# Patient Record
Sex: Female | Born: 1950 | Race: White | Hispanic: No | Marital: Married | Smoking: Never smoker
Health system: Southern US, Community
[De-identification: ages and names within clinical notes are randomized; demographics above are authoritative.]

## PROBLEM LIST (undated history)

## (undated) DIAGNOSIS — R7302 Impaired glucose tolerance (oral): Secondary | ICD-10-CM

## (undated) DIAGNOSIS — M47812 Spondylosis without myelopathy or radiculopathy, cervical region: Secondary | ICD-10-CM

## (undated) DIAGNOSIS — K5903 Drug induced constipation: Secondary | ICD-10-CM

## (undated) DIAGNOSIS — T402X5A Adverse effect of other opioids, initial encounter: Secondary | ICD-10-CM

## (undated) DIAGNOSIS — R011 Cardiac murmur, unspecified: Secondary | ICD-10-CM

## (undated) DIAGNOSIS — M1710 Unilateral primary osteoarthritis, unspecified knee: Secondary | ICD-10-CM

## (undated) DIAGNOSIS — G8929 Other chronic pain: Secondary | ICD-10-CM

## (undated) HISTORY — PX: ACROMIONECTOMY: SHX1124

## (undated) HISTORY — PX: JOINT REPLACEMENT: SHX530

## (undated) HISTORY — PX: HEMORRHOID SURGERY: SHX153

## (undated) HISTORY — PX: SKIN BIOPSY: SHX1

## (undated) HISTORY — PX: AUGMENTATION MAMMAPLASTY: SUR837

## (undated) HISTORY — PX: REPAIR EXTENSOR TENDON: SHX5382

## (undated) HISTORY — PX: TONSILLECTOMY: SUR1361

## (undated) HISTORY — PX: ANTERIOR FUSION CERVICAL SPINE: SUR626

## (undated) HISTORY — PX: RHIZOTOMY: SHX2355

---

## 1983-07-06 HISTORY — PX: BREAST BIOPSY: SHX20

## 2008-11-06 ENCOUNTER — Ambulatory Visit: Payer: Self-pay | Admitting: Internal Medicine

## 2014-10-18 ENCOUNTER — Other Ambulatory Visit: Payer: Self-pay | Admitting: Nurse Practitioner

## 2014-10-18 DIAGNOSIS — Z1231 Encounter for screening mammogram for malignant neoplasm of breast: Secondary | ICD-10-CM

## 2014-11-05 ENCOUNTER — Ambulatory Visit: Payer: Self-pay

## 2014-11-26 ENCOUNTER — Ambulatory Visit (HOSPITAL_BASED_OUTPATIENT_CLINIC_OR_DEPARTMENT_OTHER): Admission: RE | Admit: 2014-11-26 | Payer: Self-pay | Source: Ambulatory Visit

## 2014-12-11 ENCOUNTER — Ambulatory Visit: Payer: Self-pay

## 2014-12-19 ENCOUNTER — Ambulatory Visit
Admission: RE | Admit: 2014-12-19 | Discharge: 2014-12-19 | Disposition: A | Discharge status: Home / Self Care | Payer: Medicare Other | Source: Ambulatory Visit | Attending: Nurse Practitioner | Admitting: Nurse Practitioner

## 2014-12-19 DIAGNOSIS — Z1231 Encounter for screening mammogram for malignant neoplasm of breast: Secondary | ICD-10-CM

## 2015-05-23 ENCOUNTER — Encounter: Admission: RE | Payer: Self-pay | Source: Ambulatory Visit

## 2015-05-23 ENCOUNTER — Ambulatory Visit: Admission: RE | Admit: 2015-05-23 | Payer: Medicare Other | Source: Ambulatory Visit | Admitting: Gastroenterology

## 2015-05-23 SURGERY — COLONOSCOPY WITH PROPOFOL
Anesthesia: General

## 2016-04-04 ENCOUNTER — Ambulatory Visit (INDEPENDENT_AMBULATORY_CARE_PROVIDER_SITE_OTHER)
Admission: EM | Admit: 2016-04-04 | Discharge: 2016-04-04 | Disposition: A | Discharge status: Home / Self Care | Payer: Medicare Other | Source: Home / Self Care | Attending: Family Medicine | Admitting: Family Medicine

## 2016-04-04 ENCOUNTER — Encounter: Payer: Self-pay | Admitting: Emergency Medicine

## 2016-04-04 ENCOUNTER — Emergency Department
Admission: EM | Admit: 2016-04-04 | Discharge: 2016-04-04 | Disposition: A | Discharge status: Home / Self Care | Payer: Medicare Other | Attending: Emergency Medicine | Admitting: Emergency Medicine

## 2016-04-04 ENCOUNTER — Encounter: Payer: Self-pay | Admitting: Gynecology

## 2016-04-04 ENCOUNTER — Emergency Department: Payer: Medicare Other

## 2016-04-04 DIAGNOSIS — Z7982 Long term (current) use of aspirin: Secondary | ICD-10-CM | POA: Diagnosis not present

## 2016-04-04 DIAGNOSIS — W01198A Fall on same level from slipping, tripping and stumbling with subsequent striking against other object, initial encounter: Secondary | ICD-10-CM | POA: Insufficient documentation

## 2016-04-04 DIAGNOSIS — S0083XA Contusion of other part of head, initial encounter: Secondary | ICD-10-CM | POA: Insufficient documentation

## 2016-04-04 DIAGNOSIS — Y939 Activity, unspecified: Secondary | ICD-10-CM | POA: Diagnosis not present

## 2016-04-04 DIAGNOSIS — Y92007 Garden or yard of unspecified non-institutional (private) residence as the place of occurrence of the external cause: Secondary | ICD-10-CM | POA: Diagnosis not present

## 2016-04-04 DIAGNOSIS — T148XXA Other injury of unspecified body region, initial encounter: Secondary | ICD-10-CM | POA: Diagnosis not present

## 2016-04-04 DIAGNOSIS — S0990XA Unspecified injury of head, initial encounter: Secondary | ICD-10-CM

## 2016-04-04 DIAGNOSIS — Y999 Unspecified external cause status: Secondary | ICD-10-CM | POA: Diagnosis not present

## 2016-04-04 DIAGNOSIS — Z791 Long term (current) use of non-steroidal anti-inflammatories (NSAID): Secondary | ICD-10-CM | POA: Insufficient documentation

## 2016-04-04 HISTORY — DX: Unilateral primary osteoarthritis, unspecified knee: M17.10

## 2016-04-04 HISTORY — DX: Spondylosis without myelopathy or radiculopathy, cervical region: M47.812

## 2016-04-04 HISTORY — DX: Other chronic pain: G89.29

## 2016-04-04 HISTORY — DX: Cardiac murmur, unspecified: R01.1

## 2016-04-04 HISTORY — DX: Impaired glucose tolerance (oral): R73.02

## 2016-04-04 HISTORY — DX: Adverse effect of other opioids, initial encounter: T40.2X5A

## 2016-04-04 HISTORY — DX: Adverse effect of other opioids, initial encounter: K59.03

## 2016-04-04 NOTE — ED Provider Notes (Signed)
[facility] Emergency Department Provider Note   ____________________________________________    I have reviewed the triage vital signs and the nursing notes.   HISTORY  Chief Complaint Fall     HPI Jessica Valentine is a 65 y.o. female who presents with a head injury after fall. Patient reports she lost her balance this morning and fell forward and hit her right forehead. She is not on blood thinners. She denies LOC. She denies neuro deficits. She went to  urgent care and was referred to the ED. She denies dizziness or nausea. Overall she feels well. She denies neck pain. No extremity injuries. No abdominal pain or chest pain.   Past Medical History:  Diagnosis Date  . Cardiac murmur   . Cervical spondylosis   . Chronic pain   . Constipation due to opioid therapy   . IGT (impaired glucose tolerance)   . Osteoarthritis of knee, unilateral     There are no active problems to display for this patient.   Past Surgical History:  Procedure Laterality Date  . ACROMIONECTOMY    . ANTERIOR FUSION CERVICAL SPINE    . AUGMENTATION MAMMAPLASTY     1981  . BREAST BIOPSY Left 1985   neg  . HEMORRHOID SURGERY    . JOINT REPLACEMENT    . REPAIR EXTENSOR TENDON    . RHIZOTOMY    . SKIN BIOPSY     right arm  . TONSILLECTOMY      Prior to Admission medications   Medication Sig Start Date End Date Taking? Authorizing Provider  aspirin EC 81 MG tablet Take 81 mg by mouth daily.    Historical Provider, MD  Biotin 1610910000 MCG TABS Take by mouth.    Historical Provider, MD  Chromium Picolinate (CHROMIUM PICOLATE) 1000 MCG TABS Take by mouth.    Historical Provider, MD  cyclobenzaprine (FLEXERIL) 10 MG tablet Take 10 mg by mouth 3 (three) times daily as needed for muscle spasms.    Historical Provider, MD  HYDROcodone-acetaminophen (NORCO) 10-325 MG tablet Take 1 tablet by mouth every 6 (six) hours as needed.    Historical Provider, MD  meloxicam (MOBIC)  15 MG tablet Take 15 mg by mouth daily.    Historical Provider, MD  Methylnaltrexone Bromide (RELISTOR PO) Take by mouth.    Historical Provider, MD  omeprazole (PRILOSEC) 40 MG capsule Take 40 mg by mouth daily.    Historical Provider, MD  pyridoxine (B-6) 100 MG tablet Take 100 mg by mouth daily.    Historical Provider, MD     Allergies Clavulanic acid  History reviewed. No pertinent family history.  Social History Social History  Substance Use Topics  . Smoking status: Never Smoker  . Smokeless tobacco: Never Used  . Alcohol use No    Review of Systems  Constitutional: No Dizziness Eyes: No visual changes.  ENT: No neck pain Cardiovascular: Denies chest pain. Respiratory: Denies shortness of breath. Gastrointestinal: No abdominal pain.  No nausea, no vomiting.    Musculoskeletal: Negative for extremity injury Skin: Oozing to the right forehead Neurological: Negative for focal weakness  10-point ROS otherwise negative.  ____________________________________________   PHYSICAL EXAM:  VITAL SIGNS: ED Triage Vitals  Enc Vitals Group     BP 04/04/16 1409 (!) 180/86     Pulse Rate 04/04/16 1409 93     Resp 04/04/16 1409 16     Temp 04/04/16 1409 98.3 F (36.8 C)     Temp Source 04/04/16  1409 Oral     SpO2 04/04/16 1409 100 %     Weight 04/04/16 1407 152 lb (68.9 kg)     Height 04/04/16 1407 5\' 10"  (1.778 m)     Head Circumference --      Peak Flow --      Pain Score 04/04/16 1408 3     Pain Loc --      Pain Edu? --      Excl. in GC? --     Constitutional: Alert and oriented. No acute distress. Pleasant and interactive Eyes: Conjunctivae are normal.  Head:Contusion right forehead with ecchymosis and swelling, no palpable bony injury Nose: No swelling or injury Mouth/Throat: Mucous membranes are moist.   Neck:  Painless ROM, no vertebral tenderness  Cardiovascular: Normal rate, regular rhythm.  Good peripheral circulation. Respiratory: Normal respiratory  effort.  No retractions. Gastrointestinal: Soft and nontender. No distention. Genitourinary: deferred Musculoskeletal: No extremity injuries.  Warm and well perfused Neurologic:  Normal speech and language. No gross focal neurologic deficits are appreciated.  Skin:  Skin is warm, dry and intact. No rash noted. Psychiatric: Mood and affect are normal. Speech and behavior are normal.  ____________________________________________   LABS (all labs ordered are listed, but only abnormal results are displayed)  Labs Reviewed - No data to display ____________________________________________  EKG  None ____________________________________________  RADIOLOGY  CT head shows no acute injuries ____________________________________________   PROCEDURES  Procedure(s) performed: No    Critical Care performed: No ____________________________________________   INITIAL IMPRESSION / ASSESSMENT AND PLAN / ED COURSE  Pertinent labs & imaging results that were available during my care of the patient were reviewed by me and considered in my medical decision making (see chart for details).  Patient with right forehead injury. CT head pending, no current concussive symptoms.  Clinical Course  ----------------------------------------- 2:58 PM on 04/04/2016 -----------------------------------------  CT head unremarkable, patient stable for discharge. Return precautions discussed. ____________________________________________   FINAL CLINICAL IMPRESSION(S) / ED DIAGNOSES  Final diagnoses:  Closed head injury, initial encounter      NEW MEDICATIONS STARTED DURING THIS VISIT:  New Prescriptions   No medications on file     Note:  This document was prepared using Dragon voice recognition software and may include unintentional dictation errors.    Jene Every, MD 04/04/16 [phone]

## 2016-04-04 NOTE — ED Provider Notes (Signed)
CSN: [id number]     Arrival date & time 04/04/16  1211 History   None    Chief Complaint  Patient presents with  . Head Injury   (Consider location/radiation/quality/duration/timing/severity/associated sxs/prior Treatment) HPI  Past Medical History:  Diagnosis Date  . Cardiac murmur   . Cervical spondylosis   . Chronic pain   . Constipation due to opioid therapy   . IGT (impaired glucose tolerance)   . Osteoarthritis of knee, unilateral    Past Surgical History:  Procedure Laterality Date  . ACROMIONECTOMY    . ANTERIOR FUSION CERVICAL SPINE    . AUGMENTATION MAMMAPLASTY     1981  . BREAST BIOPSY Left 1985   neg  . HEMORRHOID SURGERY    . JOINT REPLACEMENT    . REPAIR EXTENSOR TENDON    . RHIZOTOMY    . SKIN BIOPSY     right arm  . TONSILLECTOMY     No family history on file. Social History  Substance Use Topics  . Smoking status: Never Smoker  . Smokeless tobacco: Never Used  . Alcohol use No   OB History    No data available     Review of Systems  Allergies  Clavulanic acid  Home Medications   Prior to Admission medications   Medication Sig Start Date End Date Taking? Authorizing Provider  aspirin EC 81 MG tablet Take 81 mg by mouth daily.   Yes Historical Provider, MD  Biotin 21308 MCG TABS Take by mouth.   Yes Historical Provider, MD  Chromium Picolinate (CHROMIUM PICOLATE) 1000 MCG TABS Take by mouth.   Yes Historical Provider, MD  cyclobenzaprine (FLEXERIL) 10 MG tablet Take 10 mg by mouth 3 (three) times daily as needed for muscle spasms.   Yes Historical Provider, MD  HYDROcodone-acetaminophen (NORCO) 10-325 MG tablet Take 1 tablet by mouth every 6 (six) hours as needed.   Yes Historical Provider, MD  meloxicam (MOBIC) 15 MG tablet Take 15 mg by mouth daily.   Yes Historical Provider, MD  Methylnaltrexone Bromide (RELISTOR PO) Take by mouth.   Yes Historical Provider, MD  omeprazole (PRILOSEC) 40 MG capsule Take 40 mg by mouth daily.   Yes  Historical Provider, MD  pyridoxine (B-6) 100 MG tablet Take 100 mg by mouth daily.   Yes Historical Provider, MD   Meds Ordered and Administered this Visit  Medications - No data to display  BP (!) 158/82 (BP Location: Left Arm)   Pulse 78   Temp 98 F (36.7 C) (Oral)   Resp 16   Ht 5\' 10"  (1.778 m)   Wt 152 lb (68.9 kg)   SpO2 100%   BMI 21.81 kg/m  No data found.   Physical Exam  Urgent Care Course   Clinical Course    Procedures (including critical care time)  Labs Review Labs Reviewed - No data to display  Imaging Review Ct Head Wo Contrast  Result Date: 04/04/2016 CLINICAL DATA:  Pt tripped in her yard and struck her head on the concrete, large hematoma right frontal area EXAM: CT HEAD WITHOUT CONTRAST TECHNIQUE: Contiguous axial images were obtained from the base of the skull through the vertex without intravenous contrast. COMPARISON:  None. FINDINGS: Brain: The ventricles are normal in size and configuration. There are no parenchymal masses or mass effect. Patchy white matter hypoattenuation is noted consistent with mild to moderate chronic microvascular ischemic change. There is no evidence of a cortical infarct. There are no extra-axial masses or abnormal  fluid collections. There is no intracranial hemorrhage. Vascular: No hyperdense vessel or unexpected calcification. Skull: Normal. Negative for fracture or focal lesion. Sinuses/Orbits: Visualize globes and orbits are unremarkable. Visualized sinuses are clear. Clear mastoid air cells. Other: Right frontal scalp hematoma. IMPRESSION: 1. No acute intracranial abnormalities. 2. Mild to moderate chronic microvascular ischemic change. 3. Right frontal scalp hematoma.  No skull fracture. Electronically Signed   By: Amie Portlandavid  Ormond M.D.   On: 04/04/2016 14:49     Visual Acuity Review  Right Eye Distance:   Left Eye Distance:   Bilateral Distance:    Right Eye Near:   Left Eye Near:    Bilateral Near:          MDM   1. Injury of head, initial encounter   2. Hematoma    Pt is to go to er for evaluation. Is on blood thinners and has swelling to eye which is concerning for orbital fracture. Will need further evaluation     Tobi BastosMelanie A Mitchell, NP 04/04/16 1554

## 2016-04-04 NOTE — ED Triage Notes (Signed)
Per patient hit her head on side walk concrete at home this am.

## 2016-04-04 NOTE — ED Triage Notes (Signed)
Tripped on sidewalk and hit head. Swelling bruising to right forehead. No LOC. No nausea or vomiting.

## 2016-04-04 NOTE — Discharge Instructions (Signed)
You have a large hematoma to RT side of head above eye and near to the orbital bone. Concerns of a fracture. You will need to go to the ER for a head CT . You are also on blood thinners and need to ensure there is not a head bleed. Discussed with MD Allena KatzPatel and we will you should be seen in the ER

## 2017-03-01 ENCOUNTER — Other Ambulatory Visit: Payer: Self-pay | Admitting: Nurse Practitioner

## 2017-03-01 DIAGNOSIS — Z1231 Encounter for screening mammogram for malignant neoplasm of breast: Secondary | ICD-10-CM

## 2017-03-21 ENCOUNTER — Ambulatory Visit: Payer: Medicare Other

## 2017-04-04 ENCOUNTER — Ambulatory Visit
Admission: RE | Admit: 2017-04-04 | Discharge: 2017-04-04 | Disposition: A | Discharge status: Home / Self Care | Payer: Medicare Other | Source: Ambulatory Visit | Attending: Nurse Practitioner | Admitting: Nurse Practitioner

## 2017-04-04 DIAGNOSIS — Z1231 Encounter for screening mammogram for malignant neoplasm of breast: Secondary | ICD-10-CM

## 2017-04-04 DIAGNOSIS — T8549XA Other mechanical complication of breast prosthesis and implant, initial encounter: Secondary | ICD-10-CM | POA: Diagnosis not present

## 2018-05-08 ENCOUNTER — Other Ambulatory Visit: Payer: Self-pay | Admitting: Nurse Practitioner

## 2018-05-08 DIAGNOSIS — Z1231 Encounter for screening mammogram for malignant neoplasm of breast: Secondary | ICD-10-CM

## 2018-05-22 ENCOUNTER — Inpatient Hospital Stay: Admission: RE | Admit: 2018-05-22 | Payer: Medicare Other | Source: Ambulatory Visit

## 2018-10-19 IMAGING — MG 2D DIGITAL SCREENING BILATERAL MAMMOGRAM WITH IMPLANTS, CAD AND
8 of 16 series · 8 of 32 positions shown · non-contrast
Comparison: Previous exam(s).

CLINICAL DATA: Screening.

EXAM:
2D DIGITAL SCREENING BILATERAL MAMMOGRAM WITH IMPLANTS, CAD AND
ADJUNCT TOMO
The patient has retroglandular implants. Standard and implant
displaced views were performed.

[L CC (1 of 2)]
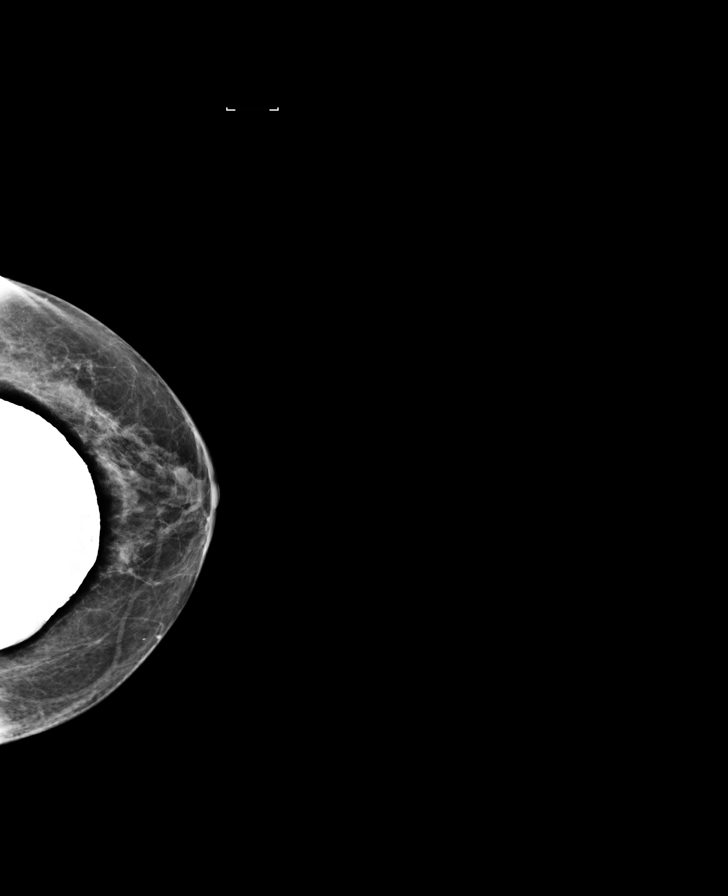

[R MLO]
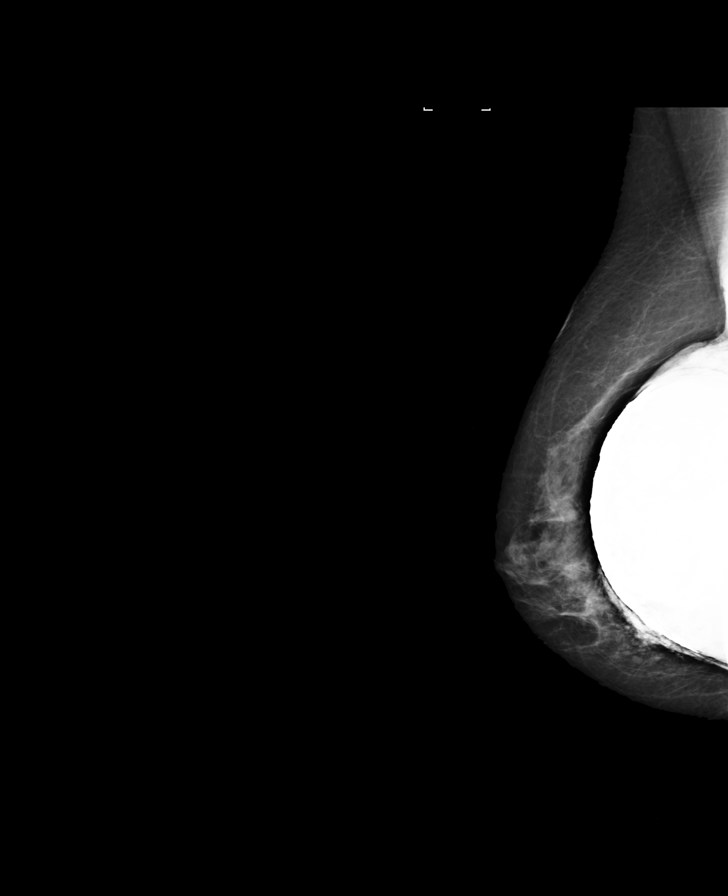

[R CC (1 of 2)]
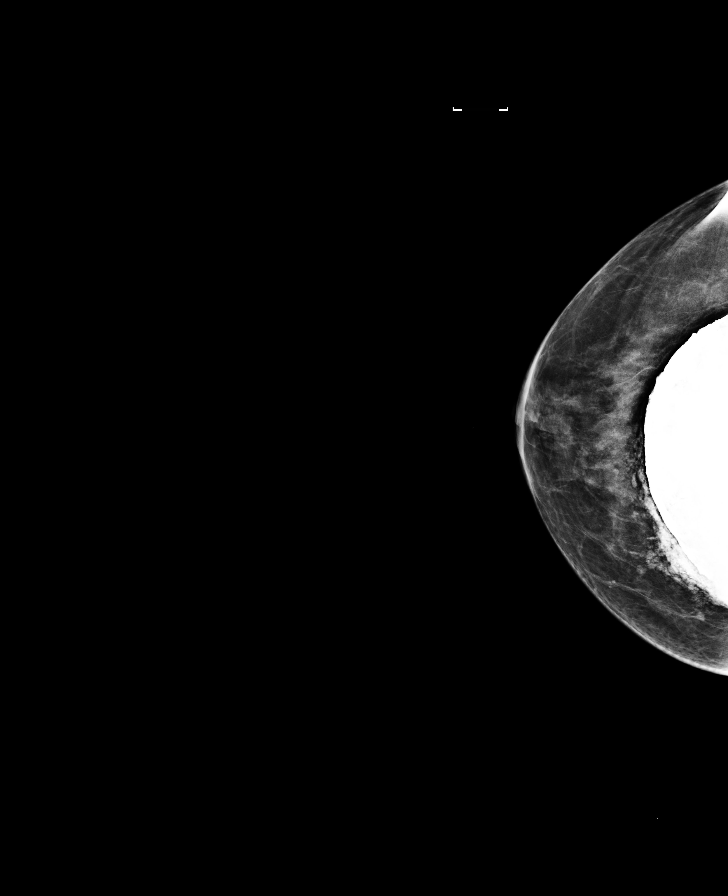

[L MLO (1 of 2)]
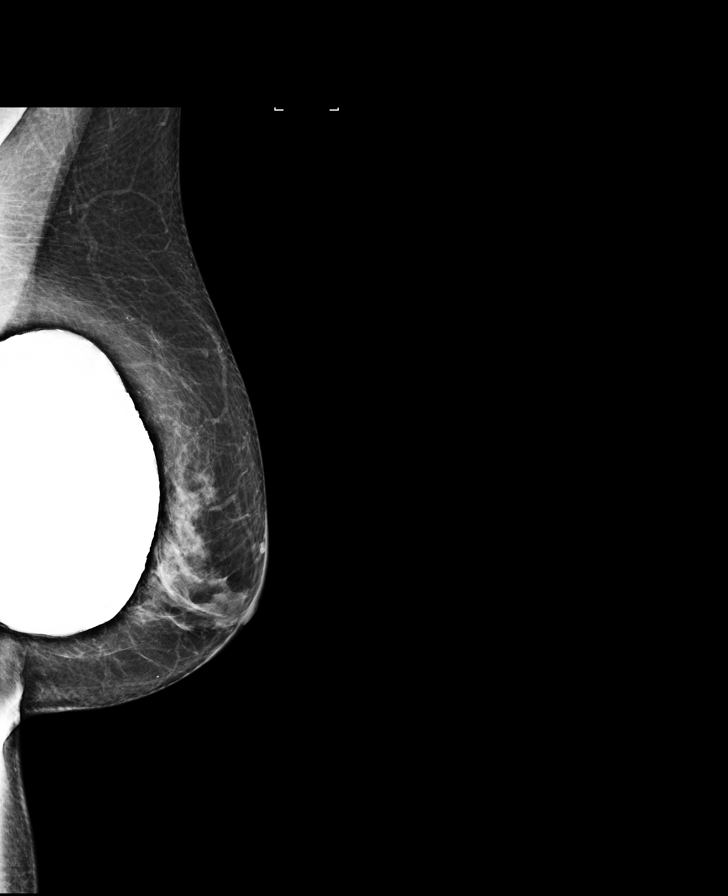

[R CC (2 of 2)]
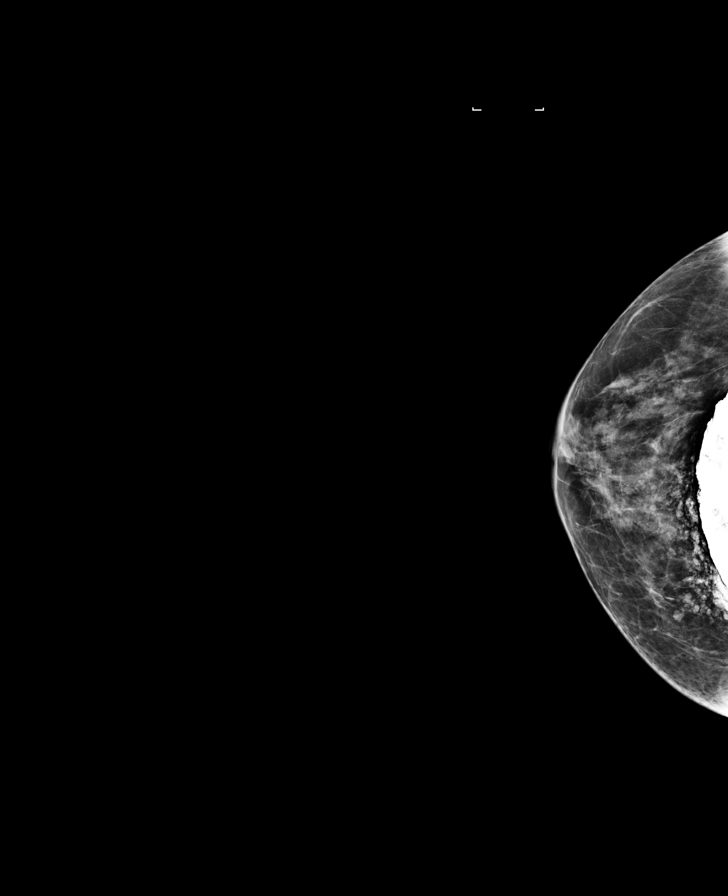

[L MLO (2 of 2)]
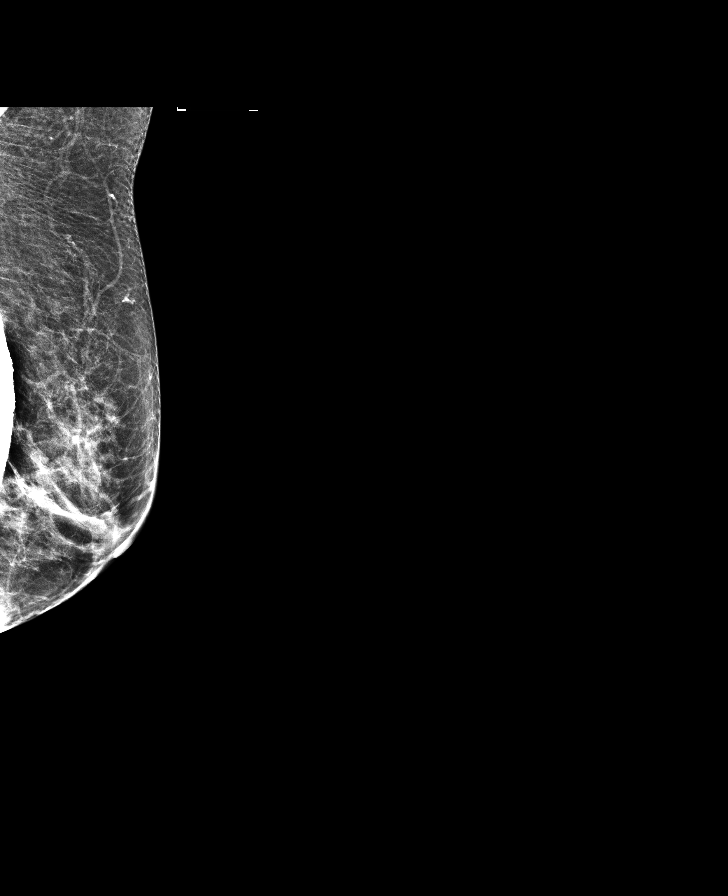

[L CC synth-2D]
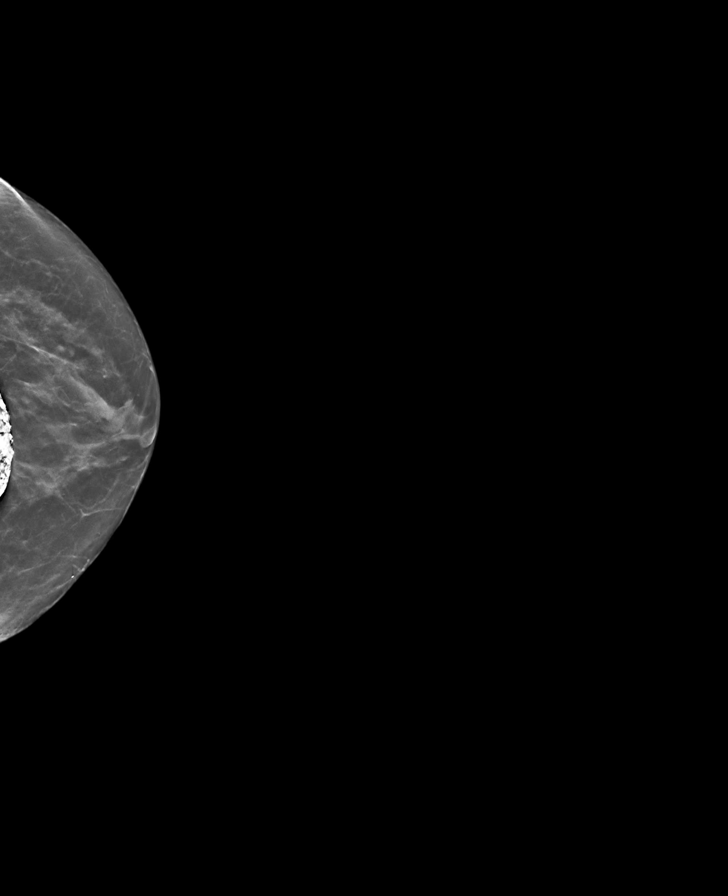

[L CC (2 of 2)]
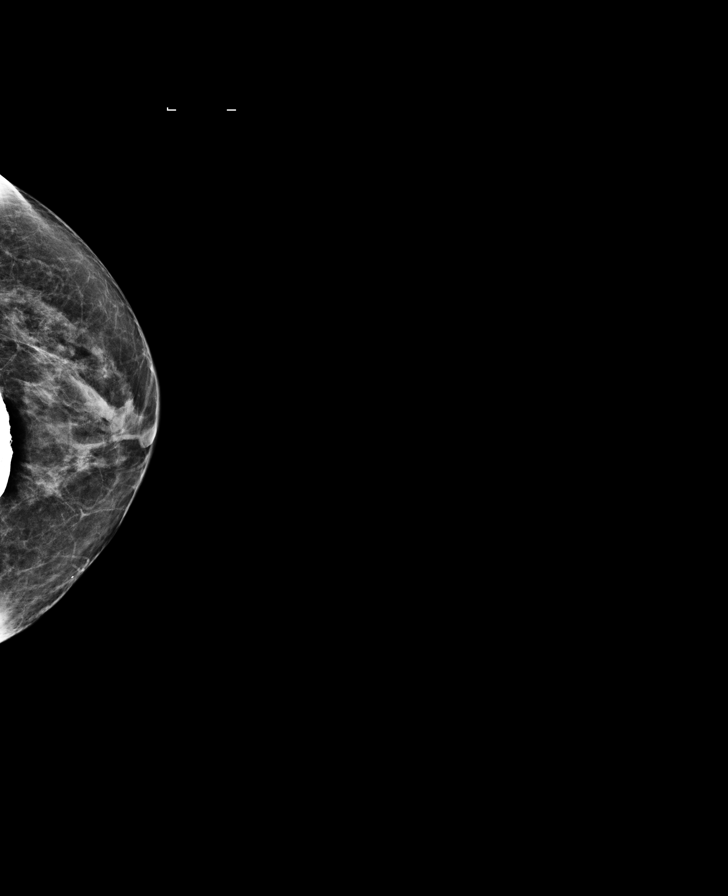

[8 of 32 positions shown; findings below may reference images not displayed]

ACR Breast Density Category b: There are scattered areas of
fibroglandular density.
FINDINGS: Interval extracapsular silicone on the right. There are no findings
suspicious for malignancy. Images were processed with CAD.
IMPRESSION: 1. Interval extracapsular silicone breast implant rupture on the
right.
2. No mammographic evidence of malignancy.
A result letter of this screening mammogram will be mailed directly
to the patient.

RECOMMENDATION:
Screening mammogram in one year. (Code:X5-4-2EA)

BI-RADS CATEGORY  1:  Negative.

## 2019-12-21 ENCOUNTER — Other Ambulatory Visit: Payer: Self-pay | Admitting: Nurse Practitioner

## 2019-12-21 DIAGNOSIS — Z1231 Encounter for screening mammogram for malignant neoplasm of breast: Secondary | ICD-10-CM

## 2020-01-03 ENCOUNTER — Other Ambulatory Visit: Payer: Self-pay

## 2020-01-03 ENCOUNTER — Ambulatory Visit
Admission: RE | Admit: 2020-01-03 | Discharge: 2020-01-03 | Disposition: A | Discharge status: Home / Self Care | Payer: Medicare PPO | Source: Ambulatory Visit | Attending: Nurse Practitioner | Admitting: Nurse Practitioner

## 2020-01-03 DIAGNOSIS — Z1231 Encounter for screening mammogram for malignant neoplasm of breast: Secondary | ICD-10-CM | POA: Diagnosis present

## 2021-04-30 ENCOUNTER — Other Ambulatory Visit: Payer: Self-pay | Admitting: Nurse Practitioner

## 2021-04-30 DIAGNOSIS — Z1231 Encounter for screening mammogram for malignant neoplasm of breast: Secondary | ICD-10-CM

## 2021-05-27 ENCOUNTER — Other Ambulatory Visit: Payer: Self-pay

## 2021-05-27 ENCOUNTER — Ambulatory Visit
Admission: RE | Admit: 2021-05-27 | Discharge: 2021-05-27 | Disposition: A | Discharge status: Home / Self Care | Payer: Medicare PPO | Source: Ambulatory Visit | Attending: Nurse Practitioner | Admitting: Nurse Practitioner

## 2021-05-27 DIAGNOSIS — Z1231 Encounter for screening mammogram for malignant neoplasm of breast: Secondary | ICD-10-CM | POA: Diagnosis present

## 2022-04-23 ENCOUNTER — Other Ambulatory Visit: Payer: Self-pay | Admitting: Nurse Practitioner

## 2022-04-23 DIAGNOSIS — Z1231 Encounter for screening mammogram for malignant neoplasm of breast: Secondary | ICD-10-CM

## 2022-05-31 ENCOUNTER — Ambulatory Visit
Admission: RE | Admit: 2022-05-31 | Discharge: 2022-05-31 | Disposition: A | Discharge status: Home / Self Care | Payer: Medicare PPO | Source: Ambulatory Visit | Attending: Nurse Practitioner | Admitting: Nurse Practitioner

## 2022-05-31 DIAGNOSIS — Z1231 Encounter for screening mammogram for malignant neoplasm of breast: Secondary | ICD-10-CM | POA: Diagnosis present

## 2023-10-11 ENCOUNTER — Other Ambulatory Visit: Payer: Self-pay | Admitting: Nurse Practitioner

## 2023-10-11 DIAGNOSIS — Z1231 Encounter for screening mammogram for malignant neoplasm of breast: Secondary | ICD-10-CM

## 2023-12-20 ENCOUNTER — Ambulatory Visit

## 2024-02-27 ENCOUNTER — Ambulatory Visit
Admission: RE | Admit: 2024-02-27 | Discharge: 2024-02-27 | Disposition: A | Discharge status: Home / Self Care | Source: Ambulatory Visit | Attending: Nurse Practitioner | Admitting: Nurse Practitioner

## 2024-02-27 DIAGNOSIS — Z1231 Encounter for screening mammogram for malignant neoplasm of breast: Secondary | ICD-10-CM | POA: Insufficient documentation
